# Patient Record
Sex: Male | Born: 1990 | Race: Black or African American | Hispanic: No | Marital: Single | Smoking: Never smoker
Health system: Southern US, Community
[De-identification: ages and names within clinical notes are randomized; demographics above are authoritative.]

---
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
## 2015-07-27 ENCOUNTER — Encounter (HOSPITAL_COMMUNITY): Payer: Self-pay | Admitting: Emergency Medicine

## 2015-07-27 ENCOUNTER — Ambulatory Visit (HOSPITAL_COMMUNITY)
Admission: EM | Admit: 2015-07-27 | Discharge: 2015-07-27 | Disposition: A | Discharge status: Home / Self Care | Payer: PRIVATE HEALTH INSURANCE | Attending: Family Medicine | Admitting: Family Medicine

## 2015-07-27 DIAGNOSIS — K029 Dental caries, unspecified: Secondary | ICD-10-CM

## 2015-07-27 MED ORDER — HYDROCODONE-ACETAMINOPHEN 5-325 MG PO TABS: 1.0000 | ORAL_TABLET | ORAL | Status: DC | PRN

## 2015-07-27 MED ORDER — CLINDAMYCIN HCL 300 MG PO CAPS: 300.0000 mg | ORAL_CAPSULE | Freq: Four times a day (QID) | ORAL | Status: DC

## 2015-07-27 NOTE — ED Notes (Signed)
Patient c/o lower right side dental pain x 1 day. Patient has been taking ibuprofen with no relief. No sensitivities to hot or cold. Has chipped that tooth in the past. Patient is in NAD. Sometimes has a headache.

## 2015-07-27 NOTE — Discharge Instructions (Signed)
Dental Caries °Dental caries is tooth decay. This decay can cause a hole in teeth (cavity) that can get bigger and deeper over time. °HOME CARE °· Brush and floss your teeth. Do this at least two times a day. °· Use a fluoride toothpaste. °· Use a mouth rinse if told by your dentist or doctor. °· Eat less sugary and starchy foods. Drink less sugary drinks. °· Avoid snacking often on sugary and starchy foods. Avoid sipping often on sugary drinks. °· Keep regular checkups and cleanings with your dentist. °· Use fluoride supplements if told by your dentist or doctor. °· Allow fluoride to be applied to teeth if told by your dentist or doctor. °  °This information is not intended to replace advice given to you by your health care provider. Make sure you discuss any questions you have with your health care provider. °  °Document Released: 11/24/2007 Document Revised: 03/07/2014 Document Reviewed: 02/17/2012 °Elsevier Interactive Patient Education ©2016 Else[employer] Inc. ° °

## 2015-07-27 NOTE — ED Provider Notes (Signed)
CSN: [id number]     Arrival date & time 07/27/15  1315 History   First MD Initiated Contact with Patient 07/27/15 1434     Chief Complaint  Patient presents with  . Dental Pain   (Consider location/radiation/quality/duration/timing/severity/associated sxs/prior Treatment) HPI History obtained from patient:  Pt presents with the cc of:  Dental pain Duration of symptoms: Since Friday Treatment prior to arrival: Over-the-counter medications including Tylenol ibuprofen Context: Sudden onset of dental pain. Patient is states that his headache caries of this tooth for quite some time but has not seen a dentist for it. Other symptoms include: None Pain score: 6 FAMILY HISTORY: Family history of hypertension    History reviewed. No pertinent past medical history. History reviewed. No pertinent past surgical history. No family history on file. Social History  Substance Use Topics  . Smoking status: Never Smoker   . Smokeless tobacco: Current User  . Alcohol Use: None     Comment: ocassionally    Review of Systems  Denies: HEADACHE, NAUSEA, ABDOMINAL PAIN, CHEST PAIN, CONGESTION, DYSURIA, SHORTNESS OF BREATH  Allergies  Review of patient's allergies indicates no known allergies.  Home Medications   Prior to Admission medications   Medication Sig Start Date End Date Taking? Authorizing Provider  clindamycin (CLEOCIN) 300 MG capsule Take 1 capsule (300 mg total) by mouth every 6 (six) hours. 07/27/15   Tharon AquasFrank C Eveny Anastas, PA  HYDROcodone-acetaminophen (NORCO/VICODIN) 5-325 MG tablet Take 1-2 tablets by mouth every 4 (four) hours as needed. 07/27/15   Tharon AquasFrank C Toree Edling, PA   Meds Ordered and Administered this Visit  Medications - No data to display  BP 138/84 mmHg  Pulse 47  Temp(Src) 99 F (37.2 C) (Oral)  Resp 12  SpO2 100% No data found.   Physical Exam NURSES NOTES AND VITAL SIGNS REVIEWED. CONSTITUTIONAL: Well developed, well nourished, no acute distress HEENT:  normocephalic, atraumatic, tooth #32 partially decayed and tender to palpation without signs of abscess or cellulitis. EYES: Conjunctiva normal NECK:normal ROM, supple, no adenopathy PULMONARY:No respiratory distress, normal effort ABDOMINAL: Soft, ND, NT BS+, No CVAT MUSCULOSKELETAL: Normal ROM of all extremities,  SKIN: warm and dry without rash PSYCHIATRIC: Mood and affect, behavior are normal  ED Course  Procedures (including critical care time)  Labs Review Labs Reviewed - No data to display  Imaging Review No results found.   Visual Acuity Review  Right Eye Distance:   Left Eye Distance:   Bilateral Distance:    Right Eye Near:   Left Eye Near:    Bilateral Near:      Prescriptions for hydrocodone and clindamycin   MDM   1. Pain due to dental caries     Patient is reassured that there are no issues that require transfer to higher level of care at this time or additional tests. Patient is advised to continue home symptomatic treatment. Patient is advised that if there are new or worsening symptoms to attend the emergency department, contact primary care provider, or return to UC. Instructions of care provided discharged home in stable condition.    THIS NOTE WAS GENERATED USING A VOICE RECOGNITION SOFTWARE PROGRAM. ALL REASONABLE EFFORTS  WERE MADE TO PROOFREAD THIS DOCUMENT FOR ACCURACY.  I have verbally reviewed the discharge instructions with the patient. A printed AVS was given to the patient.  All questions were answered prior to discharge.      Tharon AquasFrank C Hanah Moultry, PA 07/27/15 1450

## 2015-10-17 ENCOUNTER — Ambulatory Visit (HOSPITAL_COMMUNITY)
Admission: EM | Admit: 2015-10-17 | Discharge: 2015-10-17 | Disposition: A | Discharge status: Home / Self Care | Payer: PRIVATE HEALTH INSURANCE | Attending: Radiology | Admitting: Radiology

## 2015-10-17 ENCOUNTER — Encounter (HOSPITAL_COMMUNITY): Payer: Self-pay | Admitting: *Deleted

## 2015-10-17 DIAGNOSIS — K029 Dental caries, unspecified: Secondary | ICD-10-CM | POA: Diagnosis not present

## 2015-10-17 DIAGNOSIS — K0889 Other specified disorders of teeth and supporting structures: Secondary | ICD-10-CM | POA: Diagnosis not present

## 2015-10-17 MED ORDER — TRAMADOL HCL 50 MG PO TABS: 50.0000 mg | ORAL_TABLET | Freq: Four times a day (QID) | ORAL | 0 refills | Status: DC | PRN

## 2015-10-17 MED ORDER — AMOXICILLIN 500 MG PO CAPS: 500.0000 mg | ORAL_CAPSULE | Freq: Three times a day (TID) | ORAL | 0 refills | Status: DC

## 2015-10-17 NOTE — ED Provider Notes (Signed)
CSN: [id number]     Arrival date & time 10/17/15  1958 History   First MD Initiated Contact with Patient 10/17/15 2100     Chief Complaint  Patient presents with  . Dental Pain   (Consider location/radiation/quality/duration/timing/severity/associated sxs/prior Treatment) Patient presents with toothach to bottom right molan X 24 hours. Condition is acute in nature. Condition is made better by nothing Condition is made worse by nothing. Patient denies any relief from OTC pain relief prior to there arrival at this facility. Patient states that he was seen at a dentist approximately 5 months ago regarding this tooth. Patient denies any fevers, trauma or facial swelling.         History reviewed. No pertinent past medical history. History reviewed. No pertinent surgical history. History reviewed. No pertinent family history. Social History  Substance Use Topics  . Smoking status: Never Smoker  . Smokeless tobacco: Current User  . Alcohol use Not on file     Comment: ocassionally    Review of Systems  Constitutional: Negative.   HENT: Negative for facial swelling.        Toothache    Allergies  Review of patient's allergies indicates no known allergies.  Home Medications   Prior to Admission medications   Medication Sig Start Date End Date Taking? Authorizing Provider  amoxicillin (AMOXIL) 500 MG capsule Take 1 capsule (500 mg total) by mouth 3 (three) times daily. 10/17/15   Alene MiresJennifer C Omohundro, NP  clindamycin (CLEOCIN) 300 MG capsule Take 1 capsule (300 mg total) by mouth every 6 (six) hours. 07/27/15   Tharon AquasFrank C Patrick, PA  HYDROcodone-acetaminophen (NORCO/VICODIN) 5-325 MG tablet Take 1-2 tablets by mouth every 4 (four) hours as needed. 07/27/15   Tharon AquasFrank C Patrick, PA  traMADol (ULTRAM) 50 MG tablet Take 1 tablet (50 mg total) by mouth every 6 (six) hours as needed. 10/17/15   Alene MiresJennifer C Omohundro, NP   Meds Ordered and Administered this Visit  Medications - No data to  display  BP 120/60 (BP Location: Right Arm)   Pulse 80   Temp 98.6 F (37 C) (Oral)   SpO2 100%  No data found.   Physical Exam  HENT:  Decay noted to lateral side of last molar on the lower right side    Urgent Care Course   Clinical Course    Procedures (including critical care time)  Labs Review Labs Reviewed - No data to display  Imaging Review No results found.   Visual Acuity Review  Right Eye Distance:   Left Eye Distance:   Bilateral Distance:    Right Eye Near:   Left Eye Near:    Bilateral Near:         MDM   1. Pain, dental   2. Dental caries       Alene MiresJennifer C Omohundro, NP 10/17/15 2107

## 2015-10-17 NOTE — ED Triage Notes (Signed)
Pt  Has  A  Toothache r  Lower   Side  Since  Last pm       denys  Any  Other problems  Pt has had   Dental  issues  In  past

## 2016-09-02 ENCOUNTER — Encounter: Payer: Self-pay | Admitting: Registered Nurse

## 2016-09-02 ENCOUNTER — Ambulatory Visit: Payer: PRIVATE HEALTH INSURANCE | Admitting: Registered Nurse

## 2016-09-02 VITALS — BP 109/63 | HR 59 | Temp 97.3°F | Resp 18

## 2016-09-02 DIAGNOSIS — G44209 Tension-type headache, unspecified, not intractable: Secondary | ICD-10-CM

## 2016-09-02 DIAGNOSIS — S0003XA Contusion of scalp, initial encounter: Secondary | ICD-10-CM

## 2016-09-02 MED ORDER — IBUPROFEN 800 MG PO TABS: 400.0000 mg | ORAL_TABLET | Freq: Three times a day (TID) | ORAL | 0 refills | Status: AC | PRN

## 2016-09-02 NOTE — Progress Notes (Signed)
Subjective:    Patient ID: Caleb Cervantes, male    DOB: August 13, 1990, 26 y.o.   MRN: [id number]  25y/o single african [place] male here for evaluation after he hit posterior head on boxes when lifting items.  States affected area slightly tender to touch has not applied any ice yet.  He reported he had headache prior to accident and has been taking motrin 200mg  po prn headache with good relief in the past month.  He typically does not take medications every day.  Last seen in clinic Aug 2017 established.  Denied dizzyness, vision changes, arm/leg weakness, syncope, bleeding, swelling and/or worst headache of his life.  He stated his supervisor made him come to clinic for evaluation after he left work area to "cool off"       Review of Systems  Constitutional: Negative for activity change, appetite change, chills, diaphoresis, fatigue and fever.  HENT: Negative for congestion, ear discharge, ear pain, facial swelling, hearing loss, mouth sores, nosebleeds, sore throat, trouble swallowing and voice change.   Eyes: Negative for photophobia, pain, discharge, redness and visual disturbance.  Respiratory: Negative for cough, choking, shortness of breath, wheezing and stridor.   Cardiovascular: Negative for chest pain and leg swelling.  Gastrointestinal: Negative for blood in stool, constipation, diarrhea, nausea and vomiting.  Genitourinary: Negative for dysuria and hematuria.  Musculoskeletal: Positive for myalgias. Negative for arthralgias, back pain, gait problem, joint swelling, neck pain and neck stiffness.  Skin: Negative for color change, pallor, rash and wound.  Allergic/Immunologic: Negative for environmental allergies and food allergies.  Neurological: Positive for headaches. Negative for dizziness, tremors, seizures, syncope, facial asymmetry, speech difficulty, weakness, light-headedness and numbness.  Hematological: Negative for adenopathy. Does not bruise/bleed easily.    Psychiatric/Behavioral: Negative for agitation, confusion and sleep disturbance. The patient is not nervous/anxious.        Objective:   Physical Exam  Constitutional: He is oriented to person, place, and time. Vital signs are normal. He appears well-developed and well-nourished. He is active and cooperative.  Non-toxic appearance. He does not have a sickly appearance. He does not appear ill. No distress.  HENT:  Head: Normocephalic. Head is with contusion. Head is without raccoon's eyes, without Battle's sign, without abrasion, without laceration, without right periorbital erythema and without left periorbital erythema. Hair is normal.    Right Ear: Hearing, external ear and ear canal normal. A middle ear effusion is present.  Left Ear: Hearing, external ear and ear canal normal. A middle ear effusion is present.  Nose: Nose normal. No mucosal edema, rhinorrhea, nose lacerations, sinus tenderness, nasal deformity, septal deviation or nasal septal hematoma. No epistaxis.  No foreign bodies. Right sinus exhibits no maxillary sinus tenderness and no frontal sinus tenderness. Left sinus exhibits no maxillary sinus tenderness and no frontal sinus tenderness.  Mouth/Throat: Uvula is midline and mucous membranes are normal. Mucous membranes are not pale, not dry and not cyanotic. He does not have dentures. No oral lesions. No trismus in the jaw. Normal dentition. No dental abscesses, uvula swelling, lacerations or dental caries. Posterior oropharyngeal edema and posterior oropharyngeal erythema present. No oropharyngeal exudate or tonsillar abscesses.  Cobblestoning posterior pharynx and bilateral TMs air fluid level clear  Eyes: Conjunctivae, EOM and lids are normal. Pupils are equal, round, and reactive to light. Right eye exhibits no chemosis, no discharge, no exudate and no hordeolum. No foreign body present in the right eye. Left eye exhibits no chemosis, no discharge, no exudate and no hordeolum.  No foreign body present in the left eye. Right conjunctiva is not injected. Right conjunctiva has no hemorrhage. Left conjunctiva is not injected. Left conjunctiva has no hemorrhage. No scleral icterus. Right eye exhibits normal extraocular motion and no nystagmus. Left eye exhibits normal extraocular motion and no nystagmus. Right pupil is round and reactive. Left pupil is round and reactive. Pupils are equal.  Pearl 5/10 reactive to penlight full EOM bilaterally  Neck: Trachea normal, normal range of motion and phonation normal. Neck supple. No tracheal tenderness, no spinous process tenderness and no muscular tenderness present. No neck rigidity. No tracheal deviation, no edema, no erythema and normal range of motion present. No thyroid mass and no thyromegaly present.  Cardiovascular: Normal rate, regular rhythm, S1 normal, S2 normal, normal heart sounds and intact distal pulses.  PMI is not displaced.  Exam reveals no gallop and no friction rub.   No murmur heard. Pulses:      Radial pulses are 2+ on the right side, and 2+ on the left side.  Pulmonary/Chest: Effort normal and breath sounds normal. No stridor. No respiratory distress. He has no decreased breath sounds. He has no wheezes. He has no rhonchi. He has no rales.  Abdominal: Soft. Normal appearance. He exhibits no distension.  Musculoskeletal: He exhibits no edema or tenderness.       Right shoulder: Normal.       Left shoulder: Normal.       Right elbow: Normal.      Left elbow: Normal.       Right wrist: Normal.       Left wrist: Normal.       Right hip: Normal.       Left hip: Normal.       Right knee: Normal.       Left knee: Normal.       Right ankle: Normal.       Left ankle: Normal.       Cervical back: He exhibits normal range of motion, no tenderness, no bony tenderness, no swelling, no edema, no deformity, no laceration, no pain, no spasm and normal pulse.       Thoracic back: Normal.       Lumbar back: Normal.        Right hand: Normal.       Left hand: Normal.  In/out of chair without difficulty; instant ice pack given to patient from clinic stock for application to scalp in clinic; bilateral trapezius muscles taut when sitting in chair on exam but not TTP and full arom neck/shoulders  Lymphadenopathy:       Head (right side): No submental, no submandibular, no tonsillar, no preauricular, no posterior auricular and no occipital adenopathy present.       Head (left side): No submental, no submandibular, no tonsillar, no preauricular, no posterior auricular and no occipital adenopathy present.    He has no cervical adenopathy.       Right cervical: No superficial cervical, no deep cervical and no posterior cervical adenopathy present.      Left cervical: No superficial cervical, no deep cervical and no posterior cervical adenopathy present.  Neurological: He is alert and oriented to person, place, and time. He has normal strength. He is not disoriented. He displays no atrophy and no tremor. No cranial nerve deficit or sensory deficit. He exhibits normal muscle tone. He displays no seizure activity. Coordination and gait normal. GCS eye subscore is 4. GCS verbal subscore is 5. GCS motor  subscore is 6.  Bilateral hand grasp equal 5/5; in/out of chair without difficulty; gait sure and smooth in clinic  Skin: Skin is warm, dry and intact. No abrasion, no bruising, no burn, no ecchymosis, no laceration, no lesion, no petechiae and no rash noted. He is not diaphoretic. No cyanosis or erythema. No pallor. Nails show no clubbing.  Psychiatric: He has a normal mood and affect. His speech is normal and behavior is normal. Judgment and thought content normal. Cognition and memory are normal.  Nursing note and vitals reviewed.         Assessment & Plan:  A-contusion scalp initial; hit by boxes and headache  P-cryotherapy 15 minutes TID prn pain; motrin 400mg  po TID prn pain.  Dispensed 800mg  motrin tabs #30 RF0  discussed with patient to cut in half and take with food from [employer].  Follow up for re-evaluation if vision changes, worst headache of his life, weakness, dizzyness and/or worsening headache with physical exertion, repetitive vomiting or intractable nausea.  Discussed signs and symptoms of concussion with patient and at this time patient without evidence of concussion.  Discussed with patient to hydrate and eat regular meals.  Blood pressure normal probable tension headache related to mild dehydration/working lifting during shift as trapezius slightly tight bilaterally  Patient denied seasonal allergies but mild cobblestoning and bilateral otitis media with effusion noted on exam.  Patient verbalized understanding information/instructions, agreed with plan of care and had no further questions at this time.

## 2016-09-02 NOTE — Patient Instructions (Signed)
Facial or Scalp Contusion A facial or scalp contusion is a deep bruise (contusion) on the face or head. Injuries to the face and head generally cause a lot of swelling, especially around the eyes. Contusions are the result of an injury that caused bleeding under the skin. The contusion may turn blue, purple, or yellow. Minor injuries will give you a painless contusion, but more severe contusions may stay painful and swollen for a few weeks. What are the causes? A facial or scalp contusion is caused by a blunt injury, fall, or trauma to the face or head area. What are the signs or symptoms? Symptoms of this condition include:  Swelling of the injured area.  Discoloration of the injured area.  Tenderness, soreness, or pain in the injured area.  How is this diagnosed? This condition is diagnosed based on your medical history and a physical exam. An X-ray exam, CT scan, or MRI may be needed to check for any additional injuries, such as broken bones (fractures). How is this treated? Often, the best treatment for a facial or scalp contusion is applying cold compresses to the injured area. Over-the-counter medicines may also be recommended for pain control. Follow these instructions at home:  Take over-the-counter and prescription medicines only as told by your health care provider.  If directed, apply ice to the injured area. ? Put ice in a plastic bag. ? Place a towel between your skin and the bag. ? Leave the ice on for 20 minutes, 2-3 times a day.  Keep all follow-up visits as told by your health care provider. This is important. Contact a health care provider if:  You have trouble biting or chewing.  Your pain or swelling gets worse.  You have pain when you move your eyes. Get help right away if:  You have severe pain or a headache that is not relieved by medicine.  You have unusual sleepiness, confusion, or personality changes.  You vomit.  You have a nosebleed that does  not stop.  You have double vision or blurred vision.  You have a continuous clear fluid draining from your nose or ear.  You have trouble walking or using your arms or legs.  You have severe dizziness. Summary  A facial or scalp contusion is a deep bruise (contusion) on the face or head.  Contusions are the result of an injury that caused bleeding under the skin.  Minor injuries will give you a painless contusion, but more severe contusions may stay painful and swollen for a few weeks.  Often, the best treatment for a facial or scalp contusion is applying cold compresses to the injured area. This information is not intended to replace advice given to you by your health care provider. Make sure you discuss any questions you have with your health care provider. Document Released: 03/24/2004 Document Revised: 01/05/2016 Document Reviewed: 01/05/2016 Elsevier Interactive Patient Education  2017 [employer] General Headache Without Cause A headache is pain or discomfort felt around the head or neck area. The specific cause of a headache may not be found. There are many causes and types of headaches. A few common ones are:  Tension headaches.  Migraine headaches.  Cluster headaches.  Chronic daily headaches.  Follow these instructions at home: Watch your condition for any changes. Take these steps to help with your condition: Managing pain  Take over-the-counter and prescription medicines only as told by your health care provider.  Lie down in a dark, quiet room when you  have a headache.  If directed, apply ice to the head and neck area: ? Put ice in a plastic bag. ? Place a towel between your skin and the bag. ? Leave the ice on for 20 minutes, 2-3 times per day.  Use a heating pad or hot shower to apply heat to the head and neck area as told by your health care provider.  Keep lights dim if bright lights bother you or make your headaches worse. Eating and  drinking  Eat meals on a regular schedule.  Limit alcohol use.  Decrease the amount of caffeine you drink, or stop drinking caffeine. General instructions  Keep all follow-up visits as told by your health care provider. This is important.  Keep a headache journal to help find out what may trigger your headaches. For example, write down: ? What you eat and drink. ? How much sleep you get. ? Any change to your diet or medicines.  Try massage or other relaxation techniques.  Limit stress.  Sit up straight, and do not tense your muscles.  Do not use tobacco products, including cigarettes, chewing tobacco, or e-cigarettes. If you need help quitting, ask your health care provider.  Exercise regularly as told by your health care provider.  Sleep on a regular schedule. Get 7-9 hours of sleep, or the amount recommended by your health care provider. Contact a health care provider if:  Your symptoms are not helped by medicine.  You have a headache that is different from the usual headache.  You have nausea or you vomit.  You have a fever. Get help right away if:  Your headache becomes severe.  You have repeated vomiting.  You have a stiff neck.  You have a loss of vision.  You have problems with speech.  You have pain in the eye or ear.  You have muscular weakness or loss of muscle control.  You lose your balance or have trouble walking.  You feel faint or pass out.  You have confusion. This information is not intended to replace advice given to you by your health care provider. Make sure you discuss any questions you have with your health care provider. Document Released: 02/14/2005 Document Revised: 07/23/2015 Document Reviewed: 06/09/2014 Elsevier Interactive Patient Education  2017 [employer]

## 2017-03-03 ENCOUNTER — Encounter (HOSPITAL_COMMUNITY): Payer: Self-pay

## 2017-03-03 ENCOUNTER — Emergency Department (HOSPITAL_COMMUNITY): Payer: PRIVATE HEALTH INSURANCE

## 2017-03-03 ENCOUNTER — Emergency Department (HOSPITAL_COMMUNITY)
Admission: EM | Admit: 2017-03-03 | Discharge: 2017-03-03 | Disposition: A | Discharge status: Home / Self Care | Payer: PRIVATE HEALTH INSURANCE | Attending: Emergency Medicine | Admitting: Emergency Medicine

## 2017-03-03 ENCOUNTER — Other Ambulatory Visit: Payer: Self-pay

## 2017-03-03 DIAGNOSIS — Z79899 Other long term (current) drug therapy: Secondary | ICD-10-CM | POA: Diagnosis not present

## 2017-03-03 DIAGNOSIS — Z041 Encounter for examination and observation following transport accident: Secondary | ICD-10-CM | POA: Diagnosis not present

## 2017-03-03 MED ORDER — IBUPROFEN 600 MG PO TABS: 600.0000 mg | ORAL_TABLET | Freq: Four times a day (QID) | ORAL | 0 refills | Status: AC | PRN

## 2017-03-03 MED ORDER — IBUPROFEN 200 MG PO TABS
600.0000 mg | ORAL_TABLET | Freq: Once | ORAL | Status: AC
  Administered 2017-03-03: 600 mg via ORAL
  Filled 2017-03-03: qty 3

## 2017-03-03 MED ORDER — METHOCARBAMOL 500 MG PO TABS: 500.0000 mg | ORAL_TABLET | Freq: Two times a day (BID) | ORAL | 0 refills | Status: AC
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
## 2018-03-07 ENCOUNTER — Emergency Department (HOSPITAL_COMMUNITY): Payer: Self-pay

## 2018-03-07 ENCOUNTER — Emergency Department (HOSPITAL_COMMUNITY)
Admission: EM | Admit: 2018-03-07 | Discharge: 2018-03-07 | Disposition: A | Discharge status: Home / Self Care | Payer: Self-pay | Attending: Emergency Medicine | Admitting: Emergency Medicine

## 2018-03-07 ENCOUNTER — Encounter (HOSPITAL_COMMUNITY): Payer: Self-pay | Admitting: Emergency Medicine

## 2018-03-07 DIAGNOSIS — R69 Illness, unspecified: Secondary | ICD-10-CM

## 2018-03-07 DIAGNOSIS — J111 Influenza due to unidentified influenza virus with other respiratory manifestations: Secondary | ICD-10-CM | POA: Insufficient documentation

## 2018-03-07 LAB — INFLUENZA PANEL BY PCR (TYPE A & B)
Influenza A By PCR: POSITIVE — AB
Influenza B By PCR: NEGATIVE

## 2018-03-07 MED ORDER — BENZONATATE 100 MG PO CAPS: 100.0000 mg | ORAL_CAPSULE | Freq: Three times a day (TID) | ORAL | 0 refills | Status: AC

## 2018-03-07 MED ORDER — ONDANSETRON 4 MG PO TBDP: 4.0000 mg | ORAL_TABLET | Freq: Three times a day (TID) | ORAL | 0 refills | Status: AC | PRN

## 2018-03-07 NOTE — Discharge Instructions (Addendum)
Your chest x-ray showed no signs of pneumonia.  Your influenza test is pending.  If this is positive it is a viral illness and requires no antibiotics.  Have given you Tessalon for cough.  Would recommend over-the-counter decongestant such as Mucinex.  Continue taking over-the-counter Motrin and Tylenol help with fever and body aches.  Have given you Zofran for any nausea.  Drink plenty of fluids stay hydrated.  If you are not feeling any better next 2 to 3 days return the ED immediately and follow with primary care doctor as indicated.  Return the ED the worsening symptoms as well.

## 2018-03-07 NOTE — ED Notes (Signed)
Patient transported to X-ray 

## 2018-03-07 NOTE — ED Provider Notes (Signed)
[facility] Provider Note   CSN: [id number] Arrival date & time: 03/07/18  1007     History   Chief Complaint Chief Complaint  Patient presents with  . Emesis  . Nasal Congestion    HPI Caleb Cervantes is a 28 y.o. male.  HPI 28 year old male presents the ED for influenza-like illness.  Symptoms started approximately 2 to 3 days ago.  Reports nausea, vomiting, diarrhea with associated cough, nasal congestion, rhinorrhea, sore throat.  Patient reports sick contacts with similar symptoms.  He concerned that he may have the flu.  He has been taking over-the-counter cold and flu medication with some relief.  Patient also states that he has been taking Zofran at home for his nausea.  Reports that his vomiting has improved today.  Only had one episode today.  He states he is able to keep down p.o. fluids.  Reports diarrhea has also improved as well.  Patient reports generalized body aches with some abdominal cramping at times.  He states that his cough is productive and causes him pain throughout his entire body when he coughs.  Patient did not receive influenza vaccine this year.  Denies any urinary symptoms.  Denies any bloody stools.  Nothing makes symptoms better or worse. History reviewed. No pertinent past medical history.  There are no active problems to display for this patient.   History reviewed. No pertinent surgical history.      Home Medications    Prior to Admission medications   Medication Sig Start Date End Date Taking? Authorizing Provider  benzonatate (TESSALON) 100 MG capsule Take 1 capsule (100 mg total) by mouth every 8 (eight) hours. 03/07/18   Rise MuLeaphart, Raiyan Dalesandro T, PA-C  ibuprofen (ADVIL,MOTRIN) 600 MG tablet Take 1 tablet (600 mg total) by mouth every 6 (six) hours as needed. 03/03/17   Aviva KluverMurray, Alyssa B, PA-C  methocarbamol (ROBAXIN) 500 MG tablet Take 1 tablet (500 mg total) by mouth 2 (two) times daily. 03/03/17   Aviva KluverMurray, Alyssa  B, PA-C  ondansetron (ZOFRAN ODT) 4 MG disintegrating tablet Take 1 tablet (4 mg total) by mouth every 8 (eight) hours as needed for nausea or vomiting. 03/07/18   Rise MuLeaphart, Nasir Bright T, PA-C    Family History No family history on file.  Social History Social History   Tobacco Use  . Smoking status: Never Smoker  . Smokeless tobacco: Current User  Substance Use Topics  . Alcohol use: Yes    Frequency: Never    Comment: ocassionally  . Drug use: Yes    Types: Marijuana    Comment: occasinally     Allergies   No known allergies   Review of Systems Review of Systems  Constitutional: Positive for activity change, appetite change, chills and fever.  HENT: Positive for congestion, rhinorrhea, sneezing and sore throat. Negative for ear pain.   Eyes: Negative for discharge.  Respiratory: Positive for cough. Negative for shortness of breath.   Gastrointestinal: Positive for abdominal pain, diarrhea, nausea and vomiting.  Musculoskeletal: Positive for arthralgias and myalgias.  Skin: Negative for rash.  Neurological: Positive for headaches. Negative for dizziness and light-headedness.     Physical Exam Updated Vital Signs BP (!) 139/97   Pulse 87   Temp 98.7 F (37.1 C) (Oral)   Resp 12   SpO2 98%   Physical Exam Vitals signs and nursing note reviewed.  Constitutional:      General: He is not in acute distress.    Appearance: He is  well-developed. He is not ill-appearing or toxic-appearing.  HENT:     Head: Normocephalic and atraumatic.     Right Ear: Tympanic membrane, ear canal and external ear normal.     Left Ear: Tympanic membrane, ear canal and external ear normal.     Nose: Congestion and rhinorrhea present.     Mouth/Throat:     Mouth: Mucous membranes are moist.     Pharynx: No oropharyngeal exudate or posterior oropharyngeal erythema.  Eyes:     General: No scleral icterus.       Right eye: No discharge.        Left eye: No discharge.  Neck:      Musculoskeletal: Normal range of motion. No neck rigidity.  Cardiovascular:     Rate and Rhythm: Normal rate and regular rhythm.     Pulses: Normal pulses.     Heart sounds: No murmur. No friction rub. No gallop.   Pulmonary:     Effort: Pulmonary effort is normal. No respiratory distress.     Breath sounds: Normal breath sounds. No stridor. No wheezing, rhonchi or rales.  Chest:     Chest wall: No tenderness.  Abdominal:     General: Abdomen is flat. Bowel sounds are normal. There is no distension.     Tenderness: There is no abdominal tenderness. There is no right CVA tenderness, left CVA tenderness, guarding or rebound.  Musculoskeletal: Normal range of motion.  Lymphadenopathy:     Cervical: No cervical adenopathy.  Skin:    General: Skin is warm and dry.     Capillary Refill: Capillary refill takes less than 2 seconds.     Coloration: Skin is not pale.  Neurological:     Mental Status: He is alert.     Coordination: Abnormal coordination: .mdmend.  Psychiatric:        Mood and Affect: Mood normal.        Behavior: Behavior normal.        Thought Content: Thought content normal.        Judgment: Judgment normal.      ED Treatments / Results  Labs (all labs ordered are listed, but only abnormal results are displayed) Labs Reviewed  INFLUENZA PANEL BY PCR (TYPE A & B)    EKG None  Radiology Dg Chest 2 View  Result Date: 03/07/2018 CLINICAL DATA:  Fever and body aches EXAM: CHEST - 2 VIEW COMPARISON:  March 03, 2017 FINDINGS: The lungs are clear. Heart size and pulmonary vascularity are normal. No adenopathy. No bone lesions. IMPRESSION: No edema or consolidation. Electronically Signed   By: Bretta Bang III M.D.   On: 03/07/2018 11:13    Procedures Procedures (including critical care time)  Medications Ordered in ED Medications - No data to display   Initial Impression / Assessment and Plan / ED Course  I have reviewed the triage vital signs and the  nursing notes.  Pertinent labs & imaging results that were available during my care of the patient were reviewed by me and considered in my medical decision making (see chart for details).     Patient presents the ED for evaluation of influenza-like illness.  Patient reports associated nausea vomiting diarrhea.  Patient nontoxic or septic appearing on exam.  Vital signs reassuring.  Bowel sounds present in all 4 quadrants.  No focal abdominal tenderness palpation.  Lungs clear to auscultation bilaterally.  No nuchal rigidity concerning for meningitis.  No signs of otitis media on exam.  Oropharynx  is clear without signs of peritonsillar abscess or deep neck infection.  Chest x-ray showed no signs of pneumonia.  Influenza test is pending.  Regardless of influenza test is positive or negative this is likely a viral illness.  Discussed symptomatic care at home.  Patient tolerating p.o. fluids and emesis.  Patient has no focal abdominal tenderness concerning for appendicitis, diverticulitis, bowel obstruction, cholecystitis, pancreatitis, UTI, pyelonephritis.   Pt is hemodynamically stable, in NAD, & able to ambulate in the ED. Evaluation does not show pathology that would require ongoing emergent intervention or inpatient treatment. I explained the diagnosis to the patient. Pain has been managed & has no complaints prior to dc. Pt is comfortable with above plan and is stable for discharge at this time. All questions were answered prior to disposition. Strict return precautions for f/u to the ED were discussed. Encouraged follow up with PCP.    Final Clinical Impressions(s) / ED Diagnoses   Final diagnoses:  Influenza-like illness    ED Discharge Orders         Ordered    benzonatate (TESSALON) 100 MG capsule  Every 8 hours     03/07/18 1128    ondansetron (ZOFRAN ODT) 4 MG disintegrating tablet  Every 8 hours PRN     03/07/18 1128           Wallace Keller 03/07/18 1145      Azalia Bilis, MD 03/07/18 1642

## 2018-03-07 NOTE — ED Triage Notes (Signed)
Pt reports n/v on Monday and Tuesday that subsided, then began having chills/sweats, body aches, cough and congestion. Pt unsure of fevers at home.

## 2020-12-19 IMAGING — CR DG CHEST 2V
2 series · 2 of 2 positions shown · non-contrast
Comparison: March 03, 2017

CLINICAL DATA: Fever and body aches

EXAM:
CHEST - 2 VIEW

[chest pa]
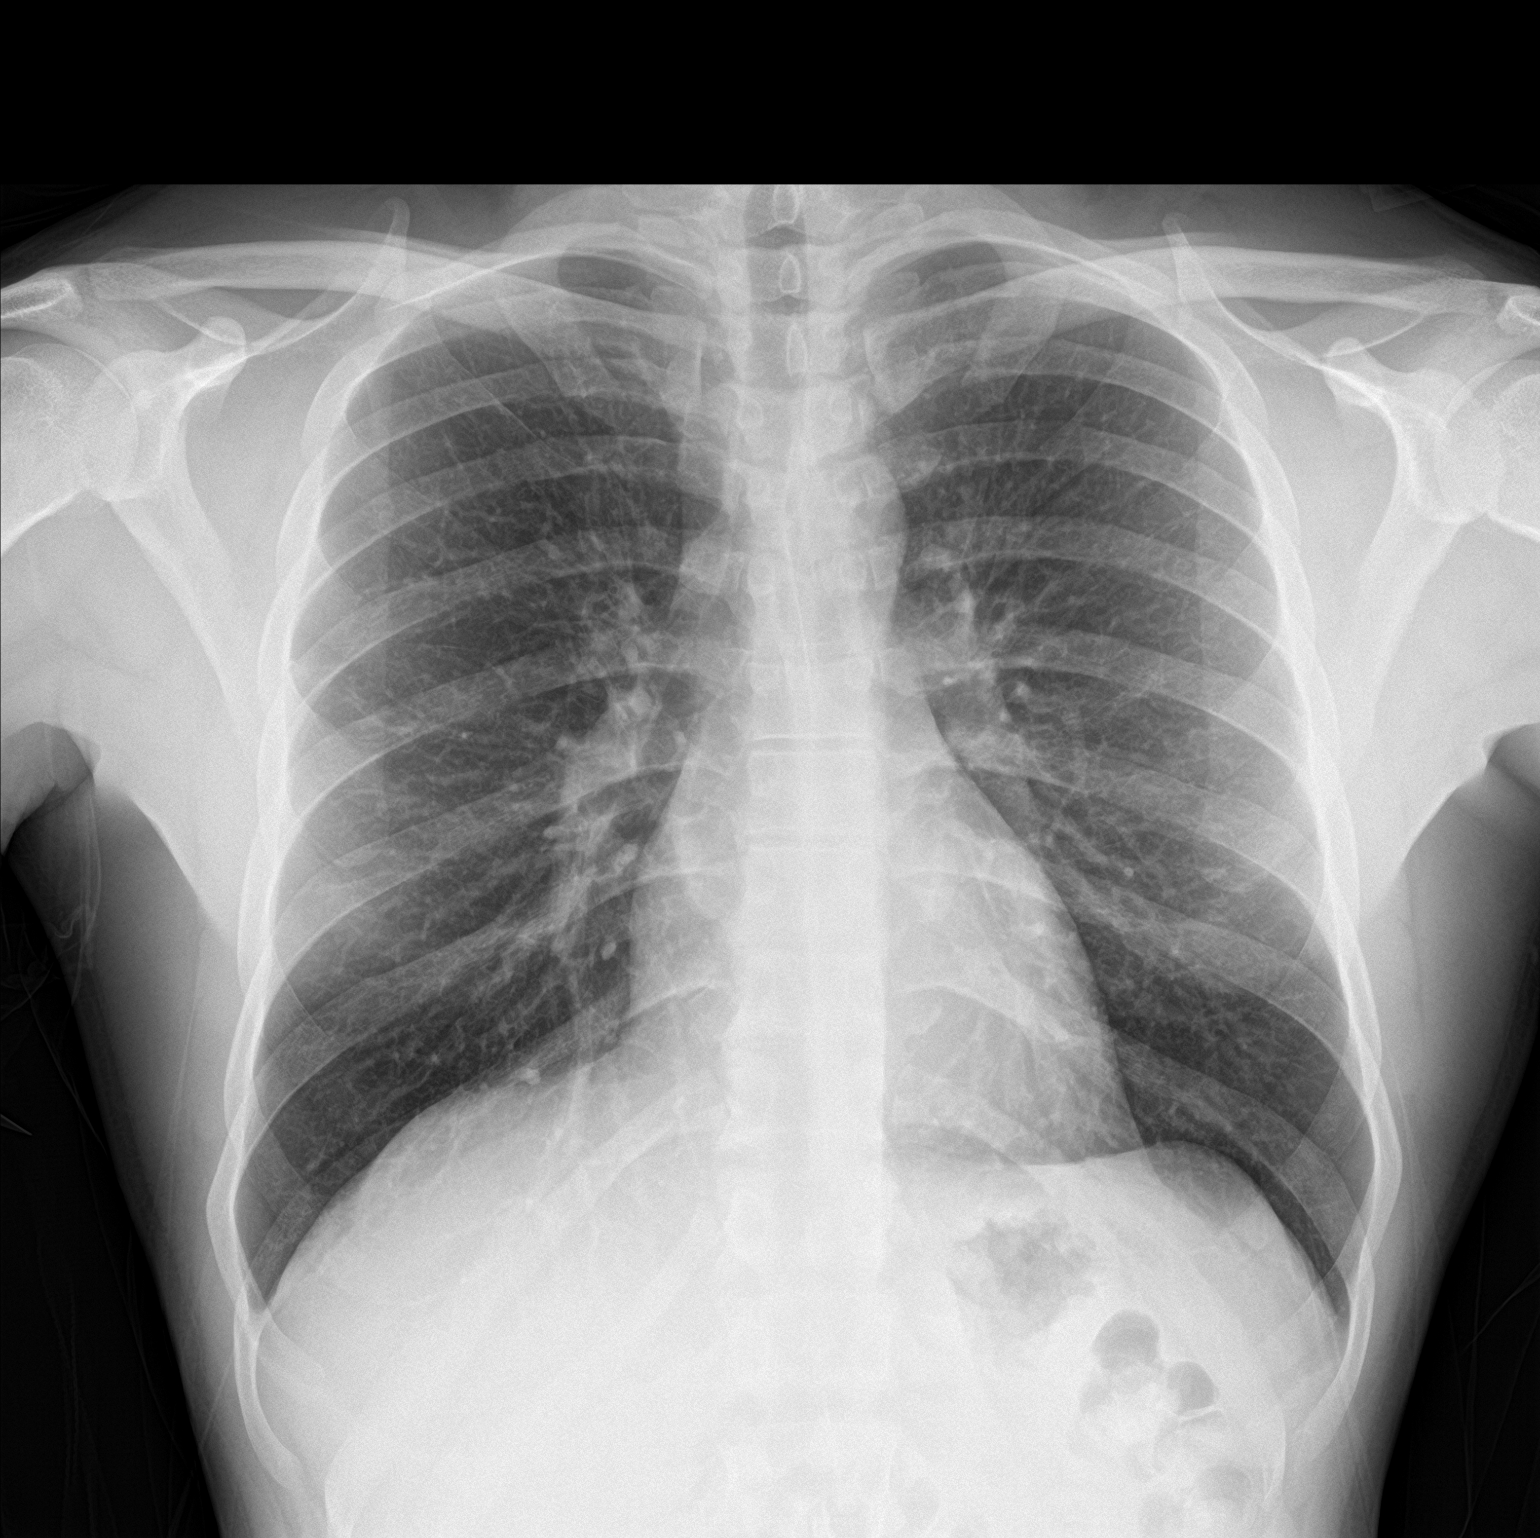

[chest lat]
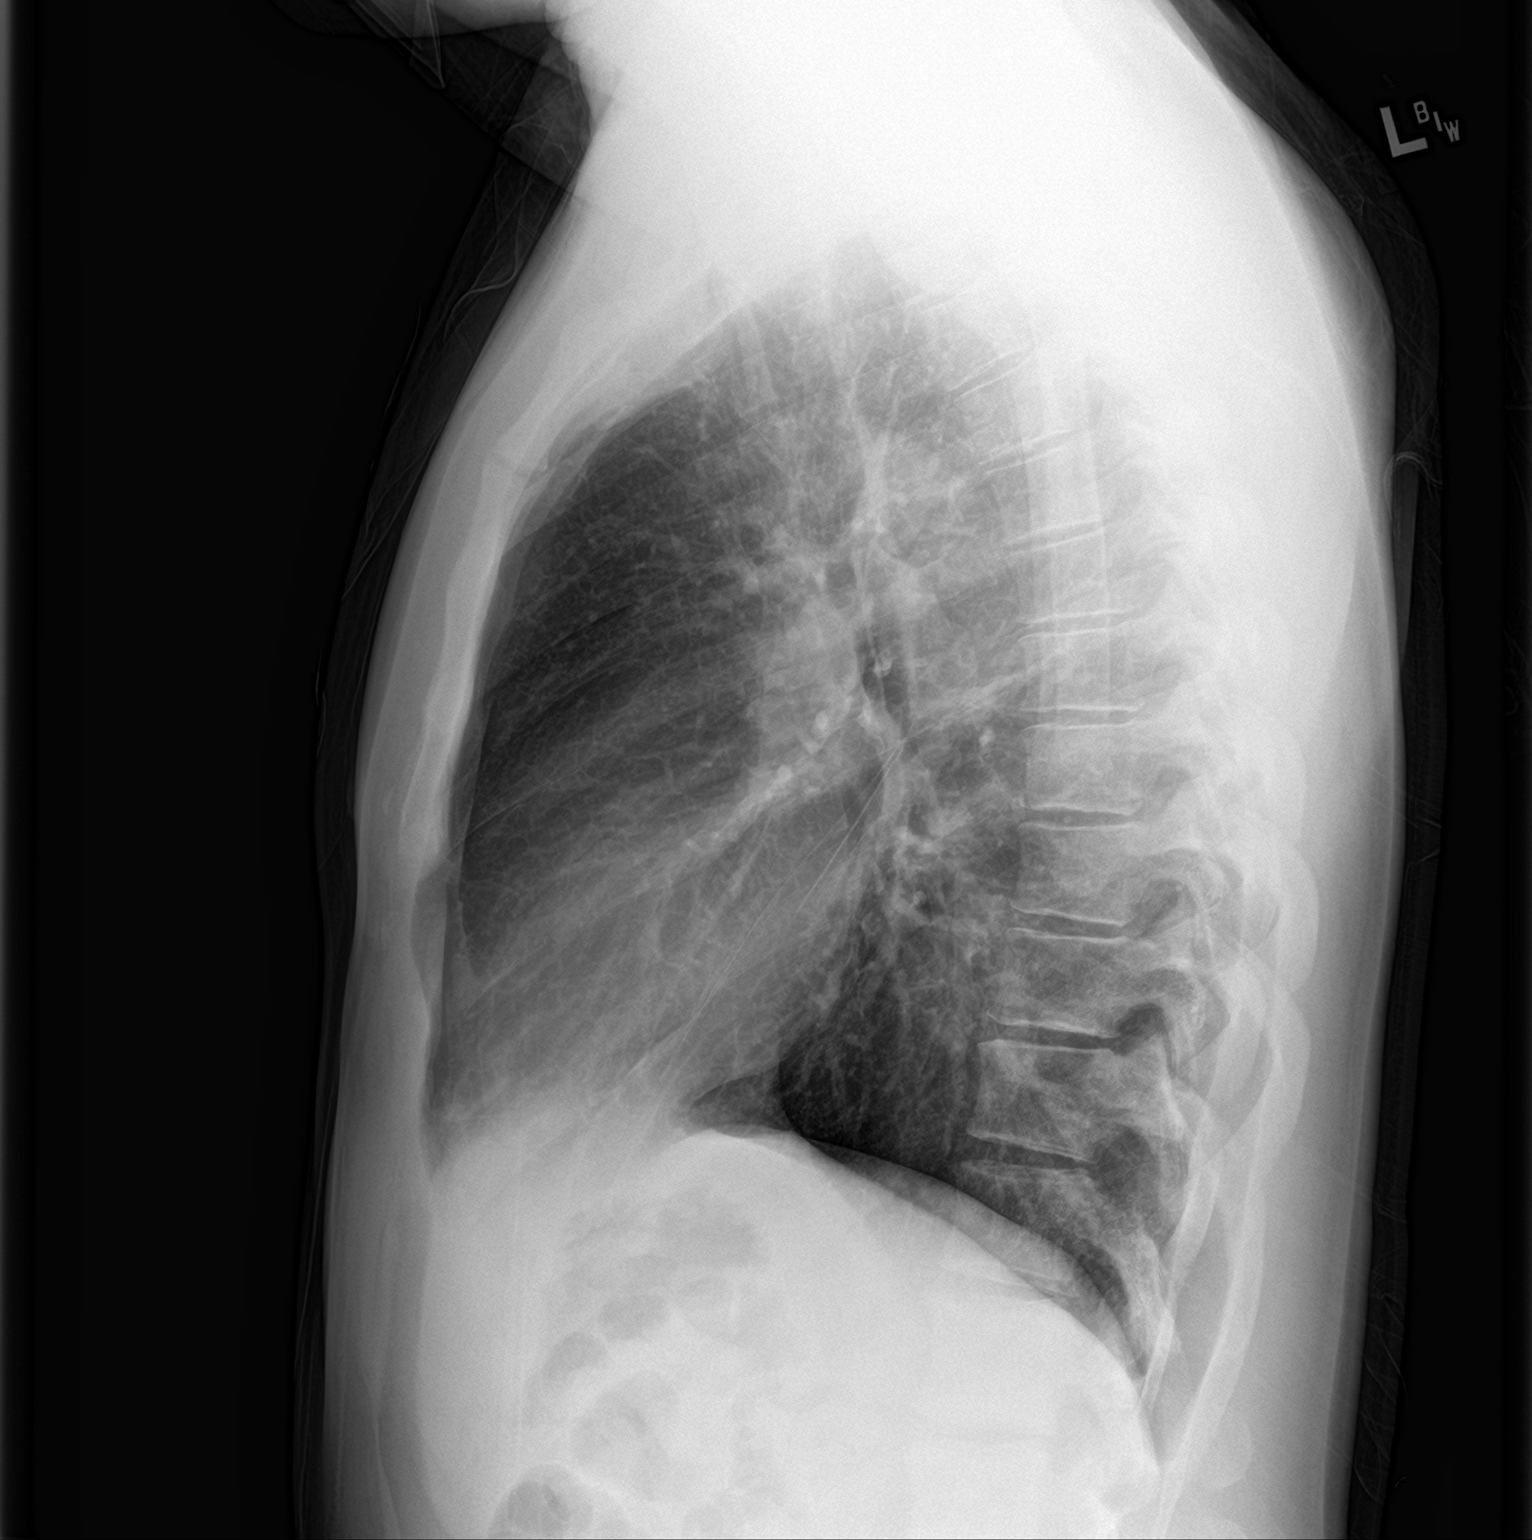

[2 of 2 positions shown; findings below may reference images not displayed]

FINDINGS: The lungs are clear. Heart size and pulmonary vascularity are
normal. No adenopathy. No bone lesions.
IMPRESSION: No edema or consolidation.
